# Patient Record
Sex: Female | Born: 1960 | Race: Black or African American | Hispanic: No | Marital: Married | Smoking: Current every day smoker
Health system: Southern US, Community
[De-identification: ages and names within clinical notes are randomized; demographics above are authoritative.]

## PROBLEM LIST (undated history)

## (undated) DIAGNOSIS — I10 Essential (primary) hypertension: Secondary | ICD-10-CM

## (undated) HISTORY — PX: SLEEVE GASTROPLASTY: SHX1101

---

## 2018-01-25 ENCOUNTER — Ambulatory Visit: Payer: BC Managed Care – PPO

## 2018-01-25 ENCOUNTER — Other Ambulatory Visit: Payer: Self-pay

## 2018-01-25 ENCOUNTER — Ambulatory Visit
Admission: EM | Admit: 2018-01-25 | Discharge: 2018-01-25 | Disposition: A | Discharge status: Home / Self Care | Payer: BC Managed Care – PPO | Attending: Family Medicine | Admitting: Family Medicine

## 2018-01-25 DIAGNOSIS — S9032XA Contusion of left foot, initial encounter: Secondary | ICD-10-CM | POA: Diagnosis not present

## 2018-01-25 DIAGNOSIS — R2242 Localized swelling, mass and lump, left lower limb: Secondary | ICD-10-CM | POA: Diagnosis not present

## 2018-01-25 DIAGNOSIS — W2203XA Walked into furniture, initial encounter: Secondary | ICD-10-CM | POA: Diagnosis not present

## 2018-01-25 DIAGNOSIS — M79672 Pain in left foot: Secondary | ICD-10-CM | POA: Diagnosis not present

## 2018-01-25 HISTORY — DX: Essential (primary) hypertension: I10

## 2018-01-25 MED ORDER — MELOXICAM 15 MG PO TABS: 15.0000 mg | ORAL_TABLET | Freq: Every day | ORAL | 0 refills | Status: AC

## 2018-01-25 NOTE — ED Provider Notes (Signed)
[facility]    CSN: [id number] Arrival date & time: 01/25/18  1133     History   Chief Complaint Chief Complaint  Patient presents with  . Toe Pain    HPI Consepcion Watts is a 57 y.o. female.   Presents to the urgent care facility for evaluation of left foot pain and swelling.  Patient states 3 days ago she stubbed her left third and fourth toe against the bed frame and developed significant pain.  She went to work the next day and had increasing pain along with swelling throughout the left foot.  She is been ambulatory with no assistive devices.  Pain is been moderate.  Pain slightly improved with ibuprofen.  She denies any other injury to her body.  No warmth, redness, bleeding no history of gout.  HPI  Past Medical History:  Diagnosis Date  . Hypertension     There are no active problems to display for this patient.   Past Surgical History:  Procedure Laterality Date  . SLEEVE GASTROPLASTY      OB History   None      Home Medications    Prior to Admission medications   Medication Sig Start Date End Date Taking? Authorizing Provider  hydrochlorothiazide (HYDRODIURIL) 25 MG tablet Take 25 mg by mouth daily.   Yes [provider]  ibuprofen (ADVIL,MOTRIN) 200 MG tablet Take 200 mg by mouth every 6 (six) hours as needed.   Yes [provider]  LISINOPRIL PO Take 25 mg by mouth.   Yes [provider]  meloxicam (MOBIC) 15 MG tablet Take 1 tablet (15 mg total) by mouth daily. 01/25/18   Evon Slack, PA-C    Family History Family History  Problem Relation Age of Onset  . Stroke Mother   . Diabetes Father   . Hypertension Father     Social History Social History   Tobacco Use  . Smoking status: Current Every Day Smoker    Packs/day: 0.50    Years: 15.00    Pack years: 7.50    Types: Cigarettes  Substance Use Topics  . Alcohol use: Yes    Comment: occasional  . Drug use: Never     Allergies   Patient has no  known allergies.   Review of Systems Review of Systems  Constitutional: Negative for fever.  Musculoskeletal: Positive for arthralgias and joint swelling. Negative for back pain, gait problem, myalgias and neck pain.  Skin: Negative for color change, rash and wound.  Neurological: Negative for numbness.     Physical Exam Triage Vital Signs ED Triage Vitals  Enc Vitals Group     BP 01/25/18 1148 (!) 176/87     Pulse Rate 01/25/18 1148 72     Resp --      Temp 01/25/18 1148 97.9 F (36.6 C)     Temp Source 01/25/18 1148 Oral     SpO2 01/25/18 1148 100 %     Weight 01/25/18 1150 168 lb (76.2 kg)     Height 01/25/18 1150 5\' 4"  (1.626 m)     Head Circumference --      Peak Flow --      Pain Score 01/25/18 1149 10     Pain Loc --      Pain Edu? --      Excl. in GC? --    No data found.  Updated Vital Signs BP (!) 176/87   Pulse 72   Temp 97.9 F (36.6  C) (Oral)   Ht 5\' 4"  (1.626 m)   Wt 168 lb (76.2 kg)   SpO2 100%   BMI 28.84 kg/m   Visual Acuity Right Eye Distance:   Left Eye Distance:   Bilateral Distance:    Right Eye Near:   Left Eye Near:    Bilateral Near:     Physical Exam  Constitutional: She is oriented to person, place, and time. She appears well-developed and well-nourished.  HENT:  Head: Normocephalic and atraumatic.  Eyes: Conjunctivae are normal.  Neck: Normal range of motion.  Cardiovascular: Normal rate.  Pulmonary/Chest: Effort normal. No respiratory distress.  Musculoskeletal: Normal range of motion.  Examination of the left lower extremity shows no swelling or edema throughout the tib-fib region or ankle.  Patient has some swelling throughout the left foot along the phalanges and distal metatarsals.  Tarsal show no sign of swelling.  She is point tender along the third and fourth phalanges of the left foot.  No tenderness throughout the metatarsals.  Sensation is intact distally.  2+ cap refill throughout the digits.  Neurological: She is  alert and oriented to person, place, and time.  Skin: Skin is warm. No rash noted.  Psychiatric: She has a normal mood and affect. Her behavior is normal. Thought content normal.     UC Treatments / Results  Labs (all labs ordered are listed, but only abnormal results are displayed) Labs Reviewed - No data to display  EKG None  Radiology Dg Foot Complete Left  Result Date: 01/25/2018 CLINICAL DATA:  LEFT foot injury, pain. EXAM: LEFT FOOT - COMPLETE 3+ VIEW COMPARISON:  None. FINDINGS: There is no evidence of fracture or dislocation. There is no evidence of arthropathy or other focal bone abnormality. Soft tissue swelling/edema over the dorsum of the foot. IMPRESSION: 1. No osseous fracture or dislocation. 2. Soft tissue swelling. Electronically Signed   By: Bary RichardStan  Maynard M.D.   On: 01/25/2018 12:15    Procedures Procedures (including critical care time)  Medications Ordered in UC Medications - No data to display  Initial Impression / Assessment and Plan / UC Course  I have reviewed the triage vital signs and the nursing notes.  Pertinent labs & imaging results that were available during my care of the patient were reviewed by me and considered in my medical decision making (see chart for details).    57 year old female with pain and swelling to the left foot x3 days.  X-rays show no evidence of acute bony normality.  Patient will rest ice and elevate left foot.  She will discontinue ibuprofen start meloxicam 15 mg daily for 7 to 10 days.   Final Clinical Impressions(s) / UC Diagnoses   Final diagnoses:  Contusion of left foot, initial encounter     Discharge Instructions     Please rest ice and elevate the foot.  Take meloxicam daily x7 to 10 days with food.  Follow-up with PCP orthopedics if no improvement in 1 week.    ED Prescriptions    Medication Sig Dispense Auth. Provider   meloxicam (MOBIC) 15 MG tablet Take 1 tablet (15 mg total) by mouth daily. 30 tablet  Ronnette JuniperGaines, Darwyn Ponzo C, PA-C       Evon SlackGaines, Eleanor Gatliff C, [place]PA-C 01/25/18 1238

## 2018-01-25 NOTE — Discharge Instructions (Addendum)
Please rest ice and elevate the foot.  Take meloxicam daily x7 to 10 days with food.  Follow-up with PCP orthopedics if no improvement in 1 week.

## 2018-01-25 NOTE — ED Triage Notes (Signed)
Pt reports accidentally kicking her bed with the left foot

## 2019-08-14 IMAGING — CR DG FOOT COMPLETE 3+V*L*
3 series · 3 of 3 positions shown · non-contrast
Comparison: None.

CLINICAL DATA: LEFT foot injury, pain.

EXAM:
LEFT FOOT - COMPLETE 3+ VIEW

[foot ap]
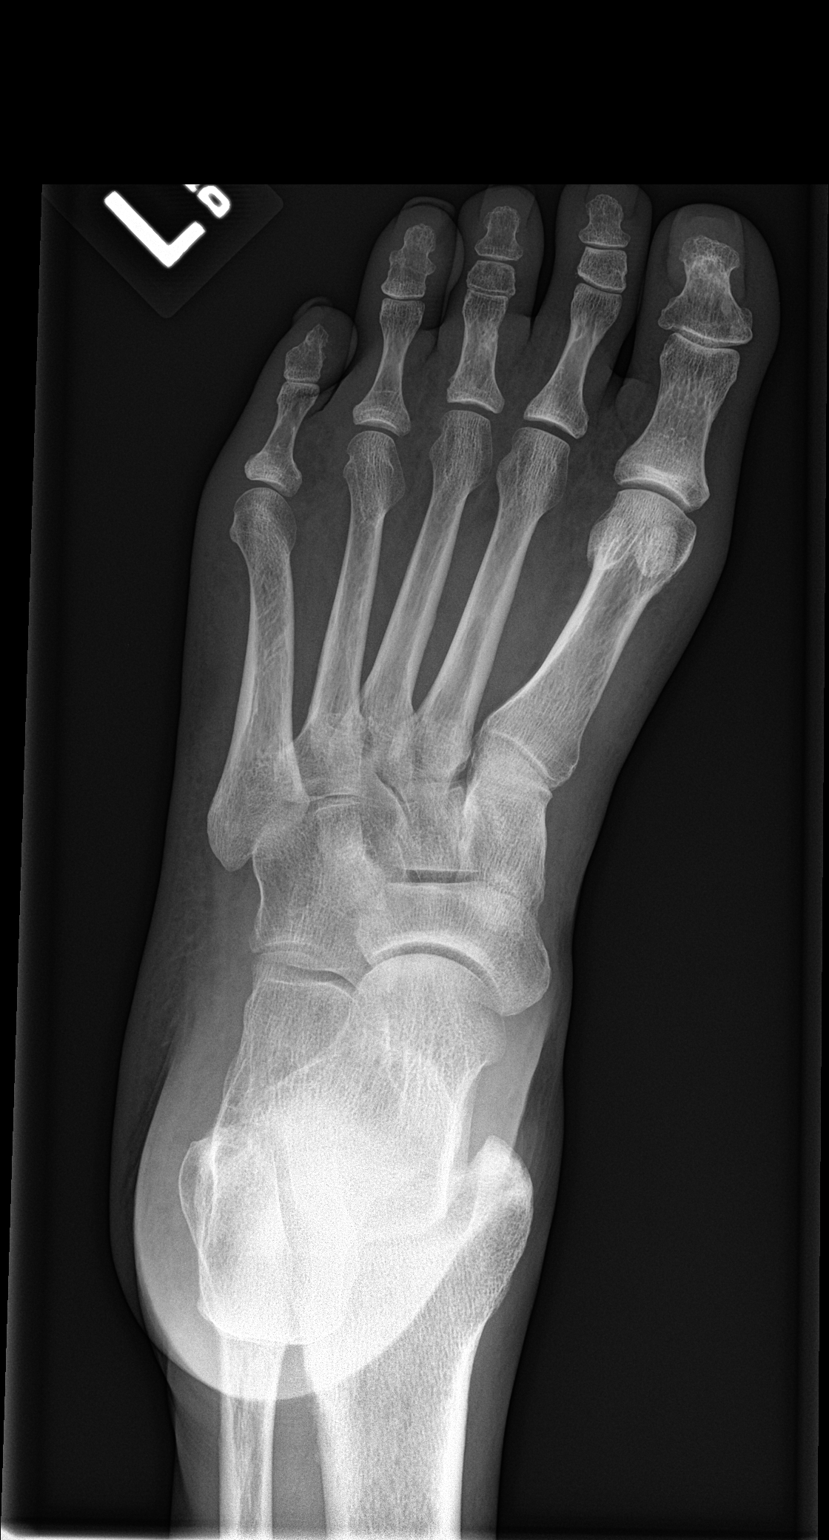

[foot obl]
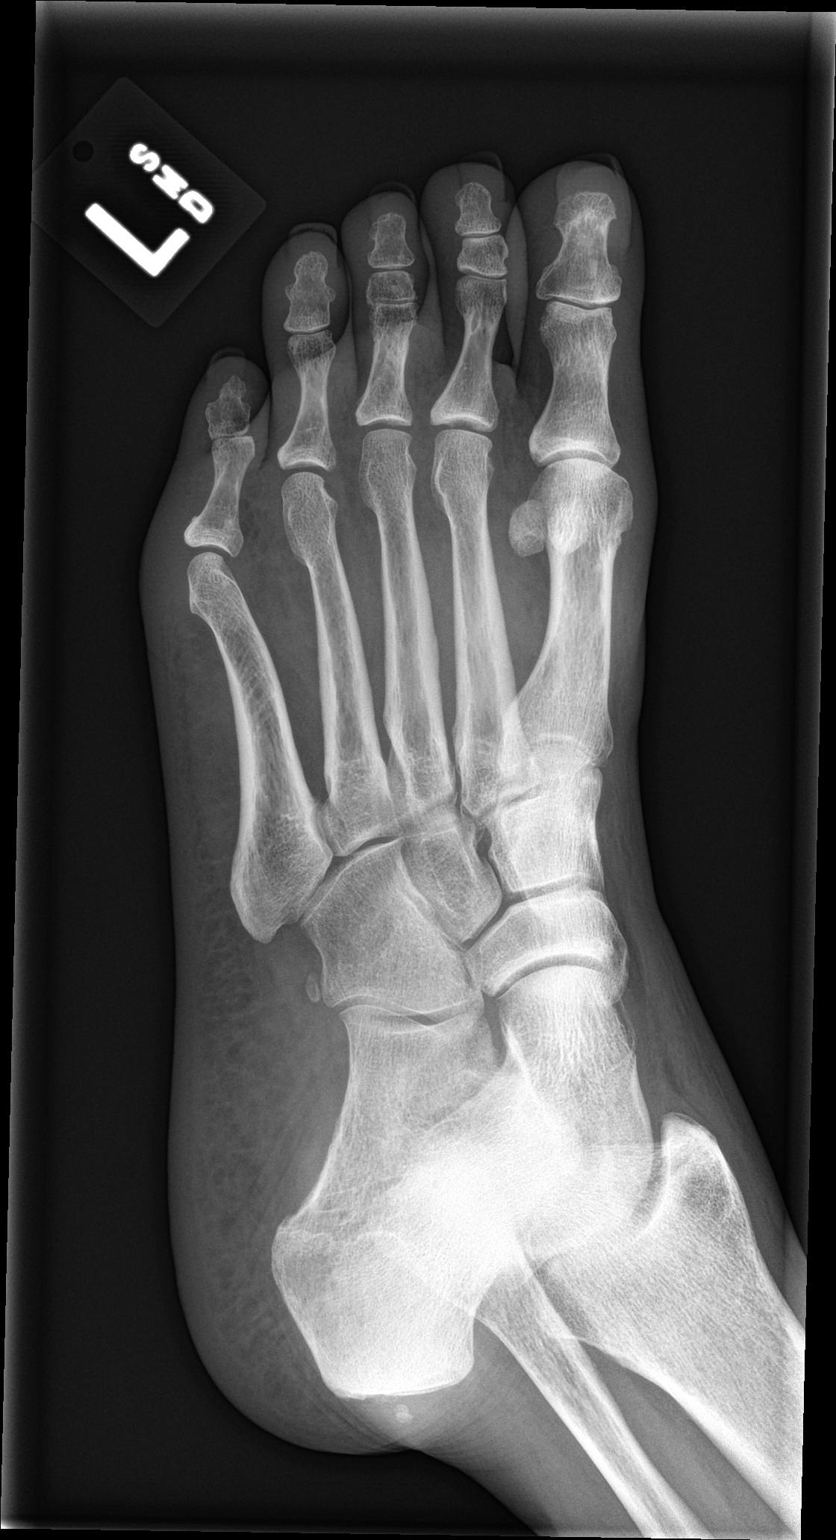

[foot lat]
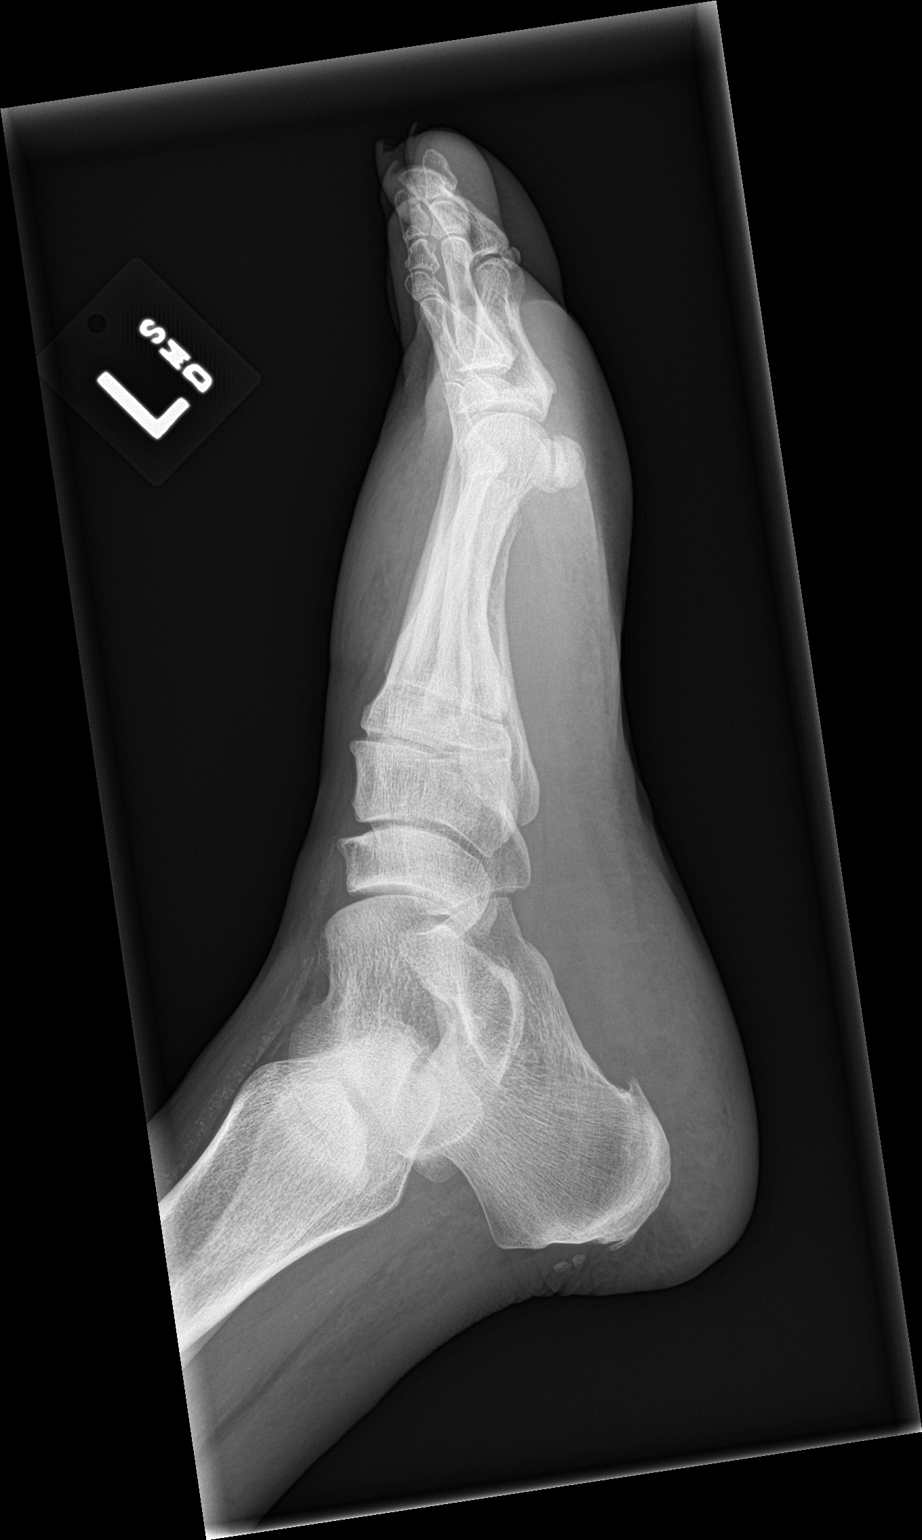

[3 of 3 positions shown; findings below may reference images not displayed]

FINDINGS: There is no evidence of fracture or dislocation. There is no
evidence of arthropathy or other focal bone abnormality. Soft tissue
swelling/edema over the dorsum of the foot.
IMPRESSION: 1. No osseous fracture or dislocation.
2. Soft tissue swelling.
